# Patient Record
Sex: Male | Born: 1980 | Race: Black or African American | Hispanic: No | Marital: Single | Smoking: Current every day smoker
Health system: Southern US, Community
[De-identification: ages and names within clinical notes are randomized; demographics above are authoritative.]

---

## 2014-04-07 ENCOUNTER — Emergency Department (HOSPITAL_BASED_OUTPATIENT_CLINIC_OR_DEPARTMENT_OTHER)
Admission: EM | Admit: 2014-04-07 | Discharge: 2014-04-07 | Disposition: A | Discharge status: Home / Self Care | Payer: Self-pay | Attending: Emergency Medicine | Admitting: Emergency Medicine

## 2014-04-07 ENCOUNTER — Encounter (HOSPITAL_BASED_OUTPATIENT_CLINIC_OR_DEPARTMENT_OTHER): Payer: Self-pay | Admitting: *Deleted

## 2014-04-07 DIAGNOSIS — Z720 Tobacco use: Secondary | ICD-10-CM | POA: Insufficient documentation

## 2014-04-07 DIAGNOSIS — Z202 Contact with and (suspected) exposure to infections with a predominantly sexual mode of transmission: Secondary | ICD-10-CM | POA: Insufficient documentation

## 2014-04-07 DIAGNOSIS — R109 Unspecified abdominal pain: Secondary | ICD-10-CM

## 2014-04-07 DIAGNOSIS — R1030 Lower abdominal pain, unspecified: Secondary | ICD-10-CM | POA: Insufficient documentation

## 2014-04-07 LAB — URINALYSIS, ROUTINE W REFLEX MICROSCOPIC
BILIRUBIN URINE: NEGATIVE
Glucose, UA: NEGATIVE mg/dL
HGB URINE DIPSTICK: NEGATIVE
KETONES UR: NEGATIVE mg/dL
Nitrite: NEGATIVE
PH: 5.5 (ref 5.0–8.0)
Protein, ur: NEGATIVE mg/dL
Specific Gravity, Urine: 1.018 (ref 1.005–1.030)
Urobilinogen, UA: 1 mg/dL (ref 0.0–1.0)

## 2014-04-07 LAB — URINE MICROSCOPIC-ADD ON

## 2014-04-07 MED ORDER — LIDOCAINE HCL (PF) 1 % IJ SOLN
INTRAMUSCULAR | Status: AC
  Administered 2014-04-07: 1.2 mL
  Filled 2014-04-07: qty 5

## 2014-04-07 MED ORDER — CEFTRIAXONE SODIUM 250 MG IJ SOLR
250.0000 mg | Freq: Once | INTRAMUSCULAR | Status: AC
  Administered 2014-04-07: 250 mg via INTRAMUSCULAR
  Filled 2014-04-07: qty 250

## 2014-04-07 MED ORDER — AZITHROMYCIN 250 MG PO TABS
1000.0000 mg | ORAL_TABLET | Freq: Once | ORAL | Status: AC
  Administered 2014-04-07: 1000 mg via ORAL
  Filled 2014-04-07: qty 4

## 2014-04-07 MED ORDER — ONDANSETRON 4 MG PO TBDP
4.0000 mg | ORAL_TABLET | Freq: Once | ORAL | Status: AC
  Administered 2014-04-07: 4 mg via ORAL
  Filled 2014-04-07: qty 1

## 2014-04-07 NOTE — ED Provider Notes (Signed)
CSN: [id number]     Arrival date & time 04/07/14  1941 History  This chart was scribed for Mirian MoMatthew Gentry, MD by Gwenyth Oberatherine Macek, ED Scribe. This patient was seen in room MH09/MH09 and the patient's care was started at 8:30 PM.    Chief Complaint  Patient presents with  . Abdominal Pain    The history is provided by the patient. No language interpreter was used.    HPI Comments: Darryl Ingram is a 33 y.o. male who presents to the Emergency Department complaining of constant, mild lower abdominal pain that started 9 hours ago. He states no associated symptoms and denies a history of similar symptoms. Pt also notes that a sexual partner recently tested positive for gonorrhea and that he is here to be tested for STDs. He denies a history of STI.  Pt denies nausea, vomiting, diarrhea, constipation, testicular pain, penile pain, penile discharge and dysuria as assiociated symptoms.  Timing constant.  Exacerbated by movement and palpation.    History reviewed. No pertinent past medical history. History reviewed. No pertinent past surgical history. History reviewed. No pertinent family history. History  Substance Use Topics  . Smoking status: Current Every Day Smoker -- 2.00 packs/day    Types: Cigarettes  . Smokeless tobacco: Not on file  . Alcohol Use: No    Review of Systems  Gastrointestinal: Positive for abdominal pain. Negative for nausea, vomiting, diarrhea and constipation.  Genitourinary: Negative for dysuria, discharge, penile pain and testicular pain.  All other systems reviewed and are negative.  Allergies  Review of patient's allergies indicates no known allergies.  Home Medications   Prior to Admission medications   Not on File   BP 134/92 mmHg  Pulse 74  Temp(Src) 98.1 F (36.7 C) (Oral)  Resp 18  Ht 6\' 2"  (1.88 m)  Wt 165 lb (74.844 kg)  BMI 21.18 kg/m2  SpO2 100% Physical Exam  Constitutional: He is oriented to person, place, and time. He appears well-developed  and well-nourished.  HENT:  Head: Normocephalic and atraumatic.  Eyes: Conjunctivae and EOM are normal.  Neck: Normal range of motion. Neck supple.  Cardiovascular: Normal rate, regular rhythm and normal heart sounds.   Pulmonary/Chest: Effort normal and breath sounds normal. No respiratory distress.  Abdominal: He exhibits no distension. There is tenderness in the suprapubic area. There is no rebound and no guarding.  Genitourinary: Testes normal and penis normal. Cremasteric reflex is present. No discharge found.  Musculoskeletal: Normal range of motion.  Neurological: He is alert and oriented to person, place, and time.  Skin: Skin is warm and dry.  Vitals reviewed.   ED Course  Procedures (including critical care time) DIAGNOSTIC STUDIES: Oxygen Saturation is 100% on RA, normal by my interpretation.    COORDINATION OF CARE: 8:34 PM Discussed treatment plan for gonorrhea and pt agreed to plan. Advised pt to follow up and seek testing for comprehensive STD screening.   Labs Review Labs Reviewed  URINALYSIS, ROUTINE W REFLEX MICROSCOPIC - Abnormal; Notable for the following:    Leukocytes, UA SMALL (*)    All other components within normal limits  URINE MICROSCOPIC-ADD ON    Imaging Review No results found.   EKG Interpretation None      MDM   Final diagnoses:  Exposure to STD  Abdominal pain, acute    33 y.o. male without pertinent PMH presents with abd pain, exposure to STI.  He denies dysuria, discharge, and states that the nature of his symptoms  are very mild.  His primary precipitant is concern about STI.  On arrival vital signs and physical exam as above. No obvious signs of infection. Will treat empirically given symptoms and exposure. Discussed importance of follow-up for further STI checks, patient refused syphilis and HIV testing at this time. Discharged home in stable condition.    1. Exposure to STD   2. Abdominal pain, acute           Mirian MoMatthew  Gentry, MD 04/07/14 2138

## 2014-04-07 NOTE — Discharge Instructions (Signed)
Chlamydia °Chlamydia is an infection. It is spread through sexual contact. Chlamydia can be in different areas of the body. These areas include the urethra, throat, or rectum. It is important to treat chlamydia as soon as possible. It can damage other organs.  °CAUSES  °Chlamydia is caused by bacteria. It is a sexually transmitted disease. This means that it is passed from an infected partner during intimate contact. This contact could be with the genitals, mouth, or rectal area.  °SIGNS AND SYMPTOMS  °There may not be any symptoms. This is often the case early in the infection. If there are symptoms, they are usually mild and may only be noticeable in the morning. Symptoms you may notice include:  °· Burning with urination. °· Pain or swelling in the testicles. °· Watery mucus-like discharge from the penis. °· Long-standing (chronic) pelvic pain after frequent infections. °· Pain, swelling, or itching around the anus. °· A sore throat. °· Itching, burning, or redness in the eyes, or discharge from the eyes. °DIAGNOSIS  °To diagnose this infection, your health care provider will do a pelvic exam. A sample of urine or a swab from the rectum may be taken for testing.  °TREATMENT  °Chlamydia is treated with antibiotic medicines.  °HOME CARE INSTRUCTIONS °· Take your antibiotic medicine as directed by your health care provider. Finish the antibiotic even if you start to feel better. Incomplete treatment will put you at risk for not being able to have children (sterility).   °· Take medicines only as directed by your health care provider.   °· Rest.   °· Inform any sexual partners about your infection. Even if they are symptom free or have a negative culture or evaluation, they should be treated for the condition.   °· Do not have sex (intercourse) until treatment is completed and your health care provider says it is okay.   °· Keep all follow-up visits as directed by your health care provider.   °· Not all test results  are available during your visit. If your test results are not back during the visit, make an appointment with your health care provider to find out the results. Do not assume everything is normal if you have not heard from your health care provider or the medical facility. It is your responsibility to get your test results. °SEEK MEDICAL CARE IF: °· You develop new joint pain. °· You have a fever. °SEEK IMMEDIATE MEDICAL CARE IF:  °· Your pain increases.   °· You have abnormal discharge.   °· You have pain during intercourse. °MAKE SURE YOU:  °· Understand these instructions. °· Will watch your condition. °· Will get help right away if you are not doing well or get worse. °Document Released: 04/24/2005 Document Revised: 09/08/2013 Document Reviewed: 10/31/2012 °ExitCare® Patient Information ©2015 [employer]. This information is not intended to replace advice given to you by your health care provider. Make sure you discuss any questions you have with your health care provider. °Gonorrhea °Gonorrhea is an infection that can cause serious problems. If left untreated, the infection may:  °· Damage the male or male organs.   °· Cause women to be unable to have children (sterility).   °· Harm a fetus if the infected woman is pregnant.   °It is important to get treatment for gonorrhea as soon as possible. It is also necessary that all your sexual partners be tested for the infection.  °CAUSES  °Gonorrhea is caused by bacteria called Neisseria gonorrhoeae. The infection is spread from person to person, usually by sexual   contact (such as by anal, vaginal, or oral means). A newborn can contract the infection from his or her mother during birth.  °SYMPTOMS  °Some people with gonorrhea do not have symptoms. Symptoms may be different in females and males.  °Females  °The most common symptoms are:  °· Pain in the lower abdomen.   °· Fever with or without chills.   °Other symptoms include:  °· Abnormal vaginal discharge.    °· Painful intercourse.   °· Burning or itching of the vagina or lips of the vagina.   °· Abnormal vaginal bleeding.   °· Pain when urinating.   °· Long-lasting (chronic) pain in the lower abdomen, especially during menstruation or intercourse.   °· Inability to become pregnant.   °· Going into premature labor.   °· Irritation, pain, bleeding, or discharge from the rectum. This may occur if the infection was spread by anal sex.   °· Sore throat or swollen lymph nodes in the neck. This may occur if the infection was spread by oral sex.   °Males  °The most common symptoms are:  °· Discharge from the penis.   °· Pain or burning during urination.   °· Pain or swelling in the testicles. °Other symptoms may include:  °· Irritation, pain, bleeding, or discharge from the rectum. This may occur if the infection was spread by anal sex.   °· Sore throat, fever, or swollen lymph nodes in the neck. This may occur if the infection was spread by oral sex.   °DIAGNOSIS  °A diagnosis is made after a physical exam is done and a sample of discharge is examined under a microscope for the presence of the bacteria. The discharge may be taken from the urethra, cervix, throat, or rectum.  °TREATMENT  °Gonorrhea is treated with antibiotic medicines. It is important for treatment to begin as soon as possible. Early treatment may prevent some problems from developing.  °HOME CARE INSTRUCTIONS  °· Take medicines only as directed by your health care provider.   °· Take your antibiotic medicine as directed by your health care provider. Finish the antibiotic even if you start to feel better. Incomplete treatment will put you at risk for continued infection.   °· Do not have sex until treatment is complete or as directed by your health care provider.   °· Keep all follow-up visits as directed by your health care provider.   °· Not all test results are available during your visit. If your test results are not back during the visit, make an  appointment with your health care provider to find out the results. Do not assume everything is normal if you have not heard from your health care provider or the medical facility. It is your responsibility to get your test results. °· If you test positive for gonorrhea, inform your recent sexual partners. They need to be checked for gonorrhea even if they do not have symptoms. They may need treatment, even if they test negative for gonorrhea.   °SEEK MEDICAL CARE IF:  °· You develop any bad reaction to the medicine you were prescribed. This may include:   °¨ A rash.   °¨ Nausea.   °¨ Vomiting.   °¨ Diarrhea.   °· Your symptoms do not improve after a few days of taking antibiotics.   °· Your symptoms get worse.   °· You develop increased pain, such as in the testicles (for males) or in the abdomen (for females). °· You have a fever. °MAKE SURE YOU:  °· Understand these instructions. °· Will watch your condition. °· Will get help right away if you are not doing well or get worse. °  Document Released: 04/21/2000 Document Revised: 09/08/2013 Document Reviewed: 10/30/2012 °ExitCare® Patient Information ©2015 [employer]. This information is not intended to replace advice given to you by your health care provider. Make sure you discuss any questions you have with your health care provider. ° ° °Emergency Department Resource Guide °1) Find a Doctor and Pay Out of Pocket °Although you won't have to find out who is covered by your insurance plan, it is a good idea to ask around and get recommendations. You will then need to call the office and see if the doctor you have chosen will accept you as a new patient and what types of options they offer for patients who are self-pay. Some doctors offer discounts or will set up payment plans for their patients who do not have insurance, but you will need to ask so you aren't surprised when you get to your appointment. ° °2) Contact Your Local Health Department °Not all health  departments have doctors that can see patients for sick visits, but many do, so it is worth a call to see if yours does. If you don't know where your local health department is, you can check in your phone book. The CDC also has a tool to help you locate your state's health department, and many state websites also have listings of all of their local health departments. ° °3) Find a Walk-in Clinic °If your illness is not likely to be very severe or complicated, you may want to try a walk in clinic. These are popping up all over the country in pharmacies, drugstores, and shopping centers. They're usually staffed by nurse practitioners or physician assistants that have been trained to treat common illnesses and complaints. They're usually fairly quick and inexpensive. However, if you have serious medical issues or chronic medical problems, these are probably not your best option. ° °No Primary Care Doctor: °- Call [facility] at  832-8000 - they can help you locate a primary care doctor that  accepts your insurance, provides certain services, etc. °- Physician Referral Service- 1-800-533-3463 ° °Chronic Pain Problems: °Organization         Address  Phone   Notes  °[facility]  (336) 297-2271 Patients need to be referred by their primary care doctor.  ° °Medication Assistance: °Organization         Address  Phone   Notes  °[place] [facility] [address] °[place], [place] [address] (336) 641-8030 --Must be a resident of [place] °-- Must have NO insurance coverage whatsoever (no Medicaid/ Medicare, etc.) °-- The pt. MUST have a primary care doctor that directs their care regularly and follows them in the community °  °[facility]  (866) 331-1348   °[employer]  (888) 892-1162   ° °Agencies that provide inexpensive medical care: °Organization         Address  Phone   Notes  °[facility]  (336) 832-8035   °[facility]     (336) 832-7272   °[facility] [address] °[place], [place] [address] (336) 832-4777   °[facility] [address], °[place] (336) 271-4999   °[facility]    (336) 373-0678   °[facility]    (336) 272-1050   °[facility] ° [address], [place] Phone:  (336) 832-4444, Fax:  (336) 832-4440 Hours of Operation:  9 am - 6 pm, M-F.    Also accepts Medicaid/Medicare and self-pay.  °[facility] ° [address], [place] Phone: (336) 832-3150, Fax: (336) 832-3151. Hours of Operation:  8:30 am - 5:30 pm, M-F.  Also accepts Medicaid and self-pay.  °[facility] [place] [address], [place] Phone: (336) 878-6027   °[facility] [address], [place], [place] (336)723-1848, Ext. 123 Mondays & Thursdays: 7-9 AM.  First 15 patients are seen on a first come, first serve basis. °  ° °Medicaid-accepting [place] Providers: ° °Organization         Address  Phone   Notes  °[facility] [address], [place] (336) 641-2100 Also accepts self-pay patients.  °[facility] [address], [place] ° (336) 856-9996   °[facility] [address], [place] (336) 288-8857   °[facility] [address] [place] [address], [place] (336) 299-7000   °Veita Bland [address], [place]  ° (336) 373-1557 Only accepts [place] Access Medicaid patients after they have their name applied to their card.  ° °Self-Pay (no insurance) in [place]: ° °Organization         Address  Phone   Notes  °Sickle Cell Patients, [facility] [address], [place] (336) 832-1970   °[facility] [address], [place] (336) 832-4400   °[facility] [place] ° [address], [place] (336) 992-4800    °[facility]/Dr. Osei-Bonsu ° [address] [place] [address], [place] or [address], [place] (336) 841-8500 Phone number for both [place] and [place] locations is the same.  °[facility] [address], [place] (336) 299-0000   °[facility] [address] [place] [address], [place] or [address] (336) 852-7530 °(336) 878-2260   °[facility] [address], [place] (336) 350-1642, phone; (336) 294-5005, fax Sees patients 1st and 3rd Saturday of every month.  Must not qualify for public or private insurance (i.e. Medicaid, Medicare, [place] Health Choice, Veterans' Benefits) • Household income should be no more than 200% of the poverty level •The clinic cannot treat you if you are pregnant or think you are pregnant • Sexually transmitted diseases are not treated at the clinic.  ° ° °Dental Care: °Organization         Address  Phone  Notes  °[facility] [address], [place] (336) 641-6152 Accepts children up to age 21 who are enrolled in Medicaid or [place] Health Choice; pregnant women with a Medicaid card; and children who have applied for Medicaid or [place] Health Choice, but were declined, whose parents can pay a reduced fee at time of service.  °[facility] [place]  [address], [place] (336) 641-7733 Accepts children up to age 21 who are enrolled in Medicaid or [place] Health Choice; pregnant women with a Medicaid card; and children who have applied for Medicaid or  Health Choice, but were declined, whose parents can pay a reduced fee at time of service.  °[facility] ° [address], [place] (336) 641-4533 Patients are seen by appointment only. Walk-ins are not accepted. [facility] will see patients 18 years of age and older. °Monday - Tuesday (8am-5pm) °  Most Wednesdays (8:30-5pm) °$30 per visit,  cash only  °[facility] ° [address], [place] (336) 641-4533 Patients are seen by appointment only. Walk-ins are not accepted. [facility] will see patients 18 years of age and older. °One Wednesday Evening (Monthly: Volunteer Based).  $30 per visit, cash only  °[facility]  (919) 537-3737 for adults; Children under age 4, call Graduate Pediatric Dentistry at (919) [phone]. Children aged 4-14, please call (919) [phone] to request a pediatric application. ° Dental services are provided in all areas of dental care including fillings, crowns and bridges, complete and partial dentures, implants, gum treatment, root canals, and extractions. Preventive care is also provided. Treatment is provided to both adults and children. °Patients are selected via a lottery and there is often a waiting list. °  °[facility] [address], °[place] ° (336) 763-8833 [email or web address] °  °[facility] [address], [place], [place] (336)723-1848, Ext. 123 Second and Fourth Thursday of each month, opens at 6:30 AM; Clinic ends at 9 AM.  Patients are seen on a first-come first-served basis, and a limited number are seen during each clinic.  ° °[facility] ° [address], [place], [place] (336) 723-7904   Eligibility Requirements °You must have lived in [place], [place], or [place] counties for at least the last three months. °  You cannot be eligible for state or federal sponsored healthcare insurance, including Veterans Administration, Medicaid, or Medicare. °  You generally cannot be eligible for healthcare insurance through your employer.  °  How to apply: °Eligibility screenings are held every Tuesday and Wednesday afternoon from 1:00 pm until 4:00 pm. You do not need an appointment for the interview!  °[facility] [address], [place], [place] 336-631-2330   °[facility]   336-342-8273   °[facility]  336-703-3100   °[facility]  336-570-6415   ° °Behavioral Health Resources in the Community: °Intensive Outpatient Programs °Organization         Address  Phone  Notes  °[facility] [address], [place], [place] 336-878-6098   °[facility] [address], [place], [place] 336-832-9800   °[facility] [address], [place], [place] ° 336-882-2125   °[facility] [address],  °[place], [place] 1-800-853-5163 or 336-641-4981   °Substance Abuse Resources °Organization         Address  Phone  Notes  °[facility]  336-882-2125   °[facility]  336-784-9470   °[facility]  336-285-9073   °[facility]  336-845-3988   °Residential & Outpatient Substance Abuse Program  1-800-659-3381   °Psychological Services °Organization         Address  Phone  Notes  °[facility]  336- 832-9600   °[facility]  336- 378-7881   °[facility] [address], [place] 1-800-853-5163 or 336-641-4981   ° °Mobile Crisis Teams °Organization         Address  Phone  Notes  °[facility], Mobile Crisis Care Unit  1-877-626-1772   °Assertive °[facility] ° [address] [place], [place] 336-834-9664   °Sharon DeEsch [address] °[place] 336-554-5454   ° °Self-Help/Support Groups °Organization         Address  Phone               Notes  °[facility] - variety of support groups  336- 373-1402 Call for more information  °Narcotics Anonymous (NA), [facility] [address], °[place]  2 meetings at this location  ° °Residential Treatment Programs °Organization         Address  Phone  Notes  °[facility] [address],    °[place]  1-866-801-8205   °[facility] ° [address], [place], [place] 704-293-8524    °[facility] [address], [place] 336-845-3988 Admissions: 8am-3pm M-F  °[facility] [address]    °[place], [place] 336-841-1104   °[facility] [address], [place], [place] 336-379-7146   °[facility] [address]  °[place], [place] 336-285-9073   °[facility] - Intensive Outpatient [address], [place], [place] 336-852-3033   °[facility] [address]  °[place], [place] 1-877-615-2722 or 336-784-9470   °[facility] [address] [place], [place] 336-227-7417 Accepts Medicaid  °[facility] [address]  °[place] 1-800-659-3381 Substance Abuse/Addiction Treatment  ° °[place] Behavioral Health Resources °Organization         Address  Phone  Notes  °[facility]  (888) 581-9988   °Julie Brannon, PhD [address] [place], [place]   (336) 349-5553 or (336) 951-0000   °[facility]   [address] °[place], [place] (336) 349-4454   °[facility] [address], [place], [place] (336) 342-8316 Insurance/Medicaid/sponsorship through [facility]  °[facility] [address]                                    [place], [place] (336) 342-8316 Therapy/tele-psych/case  °[facility] [address]  ° [place], [place] (336) 349-2233    °Dr. Arfeen  (336) 349-4544   °[facility] [place]  [employer] [place] [facility] 1) [address], [place] °2) [address], [place] °3)  [address], [place] (336) 349-3220 °(336) 342-7768 ° °(336) 342-8140   °[place] [facility] (336) 342-1394 or (336) 342-3537 (After Hours)    ° °  ° °

## 2014-04-07 NOTE — ED Notes (Signed)
Pt c/o lower abd pain denies  n/v/d, wants checked for std exposed to STD

## 2015-04-30 ENCOUNTER — Emergency Department (HOSPITAL_BASED_OUTPATIENT_CLINIC_OR_DEPARTMENT_OTHER)
Admission: EM | Admit: 2015-04-30 | Discharge: 2015-04-30 | Disposition: A | Discharge status: Home / Self Care | Payer: Self-pay | Attending: Physician Assistant | Admitting: Physician Assistant

## 2015-04-30 ENCOUNTER — Encounter (HOSPITAL_BASED_OUTPATIENT_CLINIC_OR_DEPARTMENT_OTHER): Payer: Self-pay

## 2015-04-30 DIAGNOSIS — Z8619 Personal history of other infectious and parasitic diseases: Secondary | ICD-10-CM | POA: Insufficient documentation

## 2015-04-30 DIAGNOSIS — F17210 Nicotine dependence, cigarettes, uncomplicated: Secondary | ICD-10-CM | POA: Insufficient documentation

## 2015-04-30 DIAGNOSIS — R1030 Lower abdominal pain, unspecified: Secondary | ICD-10-CM | POA: Insufficient documentation

## 2015-04-30 DIAGNOSIS — R369 Urethral discharge, unspecified: Secondary | ICD-10-CM | POA: Insufficient documentation

## 2015-04-30 MED ORDER — AZITHROMYCIN 250 MG PO TABS
1000.0000 mg | ORAL_TABLET | Freq: Once | ORAL | Status: AC
  Administered 2015-04-30: 1000 mg via ORAL
  Filled 2015-04-30: qty 4

## 2015-04-30 MED ORDER — CEFTRIAXONE SODIUM 250 MG IJ SOLR
250.0000 mg | Freq: Once | INTRAMUSCULAR | Status: AC
  Administered 2015-04-30: 250 mg via INTRAMUSCULAR
  Filled 2015-04-30: qty 250

## 2015-04-30 MED ORDER — LIDOCAINE HCL (PF) 1 % IJ SOLN
INTRAMUSCULAR | Status: AC
  Administered 2015-04-30: 1.2 mL
  Filled 2015-04-30: qty 5

## 2015-04-30 NOTE — ED Notes (Signed)
Pt c/o lower back pain, abd pain and penile d/c-pt outside smoking when called for triage-NAD

## 2015-04-30 NOTE — Discharge Instructions (Signed)
Mr. Darryl Ingram,  Nice meeting you! WEAR A CONDOM EVERY TIME YOU HAVE SEX. If your test results come back positive, you need to notify your partner and they need to be treated. Do not have intercourse with that person until they are treated, otherwise you will get infected again. Please follow-up with your primary care. Return to the emergency department if your abdominal pain worsens, if you develop fevers, chills. Feel better soon!  S. Lane HackerNicole Sarah-Jane Nazario, PA-C

## 2015-04-30 NOTE — ED Notes (Signed)
Pt refused to have the GC/Chlamydia swab done, Stating, "That's gonna hurt, Hell no. I just want the medicine."

## 2015-04-30 NOTE — ED Notes (Signed)
PA at bedside to collect swab .

## 2015-05-01 LAB — HIV ANTIBODY (ROUTINE TESTING W REFLEX): HIV Screen 4th Generation wRfx: NONREACTIVE

## 2015-05-01 LAB — RPR: RPR Ser Ql: NONREACTIVE

## 2015-05-04 NOTE — ED Provider Notes (Signed)
CSN: [id number]     Arrival date & time 04/30/15  1303 History   First MD Initiated Contact with Patient 04/30/15 1410     Chief Complaint  Patient presents with  . Back Pain   HPI   Darryl Ingram is a 34 y.o. M PMH significant for STI exposure presenting with a one week history of penile discharge and generalized lower abdominal pain. He describes the pain as 5/10 pain scale, intermittent, radiating to his back periodically, and similar to when he was diagnosed with chlamydia in the past. He is here mostly for yellow discharge from his penis, and he thinks his sexual partners was recently diagnosed with a chlamydia. He denies fevers, chills, CP, palpitations, N/V/D, recent abx use.   History reviewed. No pertinent past medical history. History reviewed. No pertinent past surgical history. No family history on file. Social History  Substance Use Topics  . Smoking status: Current Every Day Smoker -- 2.00 packs/day    Types: Cigarettes  . Smokeless tobacco: None  . Alcohol Use: Yes     Comment: weekly    Review of Systems  Ten systems are reviewed and are negative for acute change except as noted in the HPI   Allergies  Review of patient's allergies indicates no known allergies.  Home Medications   Prior to Admission medications   Not on File   BP 130/89 mmHg  Pulse 57  Temp(Src) 97.7 F (36.5 C) (Oral)  Resp 16  Ht 6\' 1"  (1.854 m)  Wt 77.111 kg  BMI 22.43 kg/m2  SpO2 100% Physical Exam  Constitutional: He appears well-developed and well-nourished. No distress.  HENT:  Head: Normocephalic and atraumatic.  Eyes: Conjunctivae are normal. Pupils are equal, round, and reactive to light. Right eye exhibits no discharge. Left eye exhibits no discharge. No scleral icterus.  Cardiovascular: Normal rate, regular rhythm, normal heart sounds and intact distal pulses.  Exam reveals no gallop and no friction rub.   No murmur heard. Pulmonary/Chest: Effort normal and breath sounds  normal. No respiratory distress. He has no wheezes. He has no rales. He exhibits no tenderness.  Abdominal: Soft. Bowel sounds are normal. He exhibits no distension and no mass. There is no tenderness. There is no rebound and no guarding.  Musculoskeletal: He exhibits no edema.  Neurological: He is alert. Coordination normal.  Skin: Skin is warm and dry. No rash noted. He is not diaphoretic. No erythema.  Psychiatric: He has a normal mood and affect. His behavior is normal.  Nursing note and vitals reviewed.   ED Course  Procedures Labs Review Labs Reviewed  RPR  HIV ANTIBODY (ROUTINE TESTING)  GC[facility]    MDM   Final diagnoses:  Penile discharge   Patient non-toxic appearing and VSS. Based on patient history and physical exam, most likely etiologies include STI. Less likely etiologies include early appendicitis, mesenteric ischemia, gastroenteritis, peritonitis, AAA, SBO, large bowel obstruction/volvulus, SBP, IBD, colitis, DKA, sickle cell crisis, IBS, food allergy. Will treat empirically and obtain GC/chlamydia swab. Patient refused swab. Will obtain urine sample. Patient may be safely discharged home. Discussed reasons for return. Patient to follow-up with primary care provider within one week. Discussed safe sex practices in great detail. Patient in understanding and agreement with the plan.    Melton KrebsSamantha Nicole Aariel Ems, PA-C 05/04/15 2229  Courteney Randall AnLyn Mackuen, MD 05/04/15 2352

## 2015-10-10 ENCOUNTER — Encounter (HOSPITAL_BASED_OUTPATIENT_CLINIC_OR_DEPARTMENT_OTHER): Payer: Self-pay | Admitting: *Deleted

## 2015-10-10 ENCOUNTER — Emergency Department (HOSPITAL_BASED_OUTPATIENT_CLINIC_OR_DEPARTMENT_OTHER)
Admission: EM | Admit: 2015-10-10 | Discharge: 2015-10-10 | Discharge status: Against Medical Advice | Payer: Self-pay | Attending: Emergency Medicine | Admitting: Emergency Medicine

## 2015-10-10 DIAGNOSIS — F17210 Nicotine dependence, cigarettes, uncomplicated: Secondary | ICD-10-CM | POA: Insufficient documentation

## 2015-10-10 DIAGNOSIS — R369 Urethral discharge, unspecified: Secondary | ICD-10-CM | POA: Insufficient documentation

## 2015-10-10 DIAGNOSIS — Z711 Person with feared health complaint in whom no diagnosis is made: Secondary | ICD-10-CM

## 2015-10-10 NOTE — ED Provider Notes (Signed)
CSN: [id number]     Arrival date & time 10/10/15  1306 History   First MD Initiated Contact with Patient 10/10/15 1317     Chief Complaint  Patient presents with  . Penile Discharge    Darryl Ingram is a 35 y.o. male who presents to the ED complaining of penile discharge for one day. He reports being sexually active and not using protection. He reports having some intermittent mild bilateral lower abdominal pain today. He reports feeling nauseated but having vomiting. He complains of penile discharge but no penile pain or testicle pain. He denies any lesions or rashes. He reports history of previous STDs.  Patient is a 35 y.o. male presenting with penile discharge. The history is provided by the patient. No language interpreter was used.  Penile Discharge Associated symptoms include abdominal pain. Pertinent negatives include no fever, nausea, rash or vomiting.    History reviewed. No pertinent past medical history. History reviewed. No pertinent past surgical history. History reviewed. No pertinent family history. Social History  Substance Use Topics  . Smoking status: Current Every Day Smoker -- 2.00 packs/day    Types: Cigarettes  . Smokeless tobacco: None  . Alcohol Use: Yes     Comment: weekly    Review of Systems  Constitutional: Negative for fever.  Gastrointestinal: Positive for abdominal pain. Negative for nausea and vomiting.  Genitourinary: Positive for discharge. Negative for dysuria, flank pain, scrotal swelling, difficulty urinating and testicular pain.  Skin: Negative for rash.      Allergies  Review of patient's allergies indicates no known allergies.  Home Medications   Prior to Admission medications   Not on File   BP 121/94 mmHg  Pulse 70  Temp(Src) 98.7 F (37.1 C) (Oral)  Resp 18  Ht 6\' 2"  (1.88 m)  Wt 77.111 kg  BMI 21.82 kg/m2  SpO2 99% Physical Exam  Constitutional: He appears well-developed and well-nourished. No distress.  HENT:  Head:  Normocephalic and atraumatic.  Eyes: Right eye exhibits no discharge. Left eye exhibits no discharge.  Cardiovascular: Normal rate, regular rhythm, normal heart sounds and intact distal pulses.   Pulmonary/Chest: Effort normal and breath sounds normal. No respiratory distress.  Abdominal: Soft. He exhibits no distension. There is no tenderness. There is no rebound.  Abdomen is soft and nontender to palpation.  Genitourinary:  Refuses   Neurological: He is alert. Coordination normal.  Skin: Skin is warm and dry. No rash noted. He is not diaphoretic. No erythema. No pallor.  Psychiatric: His speech is normal. His affect is angry. He is aggressive.  Nursing note and vitals reviewed.   ED Course  Procedures (including critical care time) Labs Review Labs Reviewed - No data to display  Imaging Review No results found.    EKG Interpretation None      Filed Vitals:   10/10/15 1314  BP: 121/94  Pulse: 70  Temp: 98.7 F (37.1 C)  TempSrc: Oral  Resp: 18  Height: 6\' 2"  (1.88 m)  Weight: 77.111 kg  SpO2: 99%     MDM   Meds given in ED:  Medications - No data to display  New Prescriptions   No medications on file    Final diagnoses:  Concern about STD in male without diagnosis   This is a 35 y.o. male who presents to the ED complaining of penile discharge for one day. He reports being sexually active and not using protection. He reports having some intermittent mild bilateral lower abdominal  pain today. He reports feeling nauseated but having vomiting. He complains of penile discharge but no penile pain or testicle pain. He denies any lesions or rashes. He reports history of previous STDs. On exam the patient is afebrile nontoxic appearing. His abdomen is soft and nontender to palpation.  Patient refuses GU exam. I explained in detail the reasons why we need to test for gonorrhea and Chlamydia and to examine his genitals. Patient reports he does not want anyone to touch him  and he just wants a shot and to go. While in the room with my male ER tech as chaperone I again explained the need to do a GU exam. Patient became agitated and aggressive. He began yelling and told me he did not want "no fuckers fondling my balls."  I again explained that I was his provider and I would be doing a physical exam and that this was important for his health. He then stood up and got in my face cursing at me from across the stretcher and I felt threatened. I walked out of the room and told him if he did not want to follow my medical advice he could go to the health department or somewhere else. The patient got dressed and walked out yelling at me "don't fucking talk to me." He left the ED without further incident.     Everlene Farrier, PA-C 10/10/15 1351  Lavera Guise, MD 10/11/15 [phone]

## 2015-10-10 NOTE — ED Notes (Signed)
Pt leaving the department at this time.

## 2015-10-10 NOTE — ED Notes (Signed)
Pt reports lower abdominal pain and penile discharge x 2 days.  Denies urinary symptoms.

## 2015-10-11 ENCOUNTER — Encounter (HOSPITAL_BASED_OUTPATIENT_CLINIC_OR_DEPARTMENT_OTHER): Payer: Self-pay | Admitting: *Deleted

## 2015-10-11 ENCOUNTER — Emergency Department (HOSPITAL_BASED_OUTPATIENT_CLINIC_OR_DEPARTMENT_OTHER)
Admission: EM | Admit: 2015-10-11 | Discharge: 2015-10-11 | Disposition: A | Discharge status: Home / Self Care | Payer: Self-pay | Attending: Emergency Medicine | Admitting: Emergency Medicine

## 2015-10-11 DIAGNOSIS — Z202 Contact with and (suspected) exposure to infections with a predominantly sexual mode of transmission: Secondary | ICD-10-CM | POA: Insufficient documentation

## 2015-10-11 DIAGNOSIS — F17210 Nicotine dependence, cigarettes, uncomplicated: Secondary | ICD-10-CM | POA: Insufficient documentation

## 2015-10-11 MED ORDER — CEFTRIAXONE SODIUM 250 MG IJ SOLR
250.0000 mg | Freq: Once | INTRAMUSCULAR | Status: AC
  Administered 2015-10-11: 250 mg via INTRAMUSCULAR
  Filled 2015-10-11: qty 250

## 2015-10-11 MED ORDER — LIDOCAINE HCL (PF) 1 % IJ SOLN
INTRAMUSCULAR | Status: AC
  Administered 2015-10-11: 0.9 mL
  Filled 2015-10-11: qty 5

## 2015-10-11 MED ORDER — AZITHROMYCIN 250 MG PO TABS
1000.0000 mg | ORAL_TABLET | Freq: Once | ORAL | Status: AC
  Administered 2015-10-11: 1000 mg via ORAL
  Filled 2015-10-11: qty 4

## 2015-10-11 NOTE — ED Notes (Signed)
Lab called to cancel UA per Dr Lynelle DoctorKnapp.

## 2015-10-11 NOTE — ED Provider Notes (Signed)
CSN: [id number]     Arrival date & time 10/11/15  1102 History   First MD Initiated Contact with Patient 10/11/15 1122     Chief Complaint  Patient presents with  . Exposure to STD    HPI Patient was seen in the emergency room yesterday after complaining of penile discharge for 1 day. Patient does have a new partner and has not been using protection. He denies any trouble with nausea vomiting or diarrhea. No penile or testicular pain. He denies any rashes. Patient refused treatment at this facility yesterday. He refused any physical exam. Patient states he felt uncomfortable with getting examined. He came back to the emergency room today because he was called by his partner and was told that she was being treated for an STD.  She had been seen in the health department and she told him he needed to get treatment. The patient called the health department but was told he needed to make an appointment. He decided to come back to this facility History reviewed. No pertinent past medical history. History reviewed. No pertinent past surgical history. No family history on file. Social History  Substance Use Topics  . Smoking status: Current Every Day Smoker -- 2.00 packs/day    Types: Cigarettes  . Smokeless tobacco: None  . Alcohol Use: Yes     Comment: weekly    Review of Systems  All other systems reviewed and are negative.     Allergies  Review of patient's allergies indicates no known allergies.  Home Medications   Prior to Admission medications   Not on File   BP 144/90 mmHg  Pulse 89  Temp(Src) 98 F (36.7 C) (Oral)  Resp 16  Ht 6\' 2"  (1.88 m)  Wt 77.111 kg  BMI 21.82 kg/m2  SpO2 99% Physical Exam  Constitutional: He appears well-developed and well-nourished. No distress.  HENT:  Head: Normocephalic and atraumatic.  Right Ear: External ear normal.  Left Ear: External ear normal.  Eyes: Conjunctivae are normal. Right eye exhibits no discharge. Left eye exhibits no  discharge. No scleral icterus.  Neck: Neck supple. No tracheal deviation present.  Cardiovascular: Normal rate.   Pulmonary/Chest: Effort normal. No stridor. No respiratory distress.  Genitourinary:  Exam deferred  Musculoskeletal: He exhibits no edema.  Neurological: He is alert. Cranial nerve deficit: no gross deficits.  Skin: Skin is warm and dry. No rash noted.  Psychiatric: He has a normal mood and affect.  Nursing note and vitals reviewed.   ED Course  Procedures (including critical care time)   MDM   Final diagnoses:  Exposure to STD    Will treat empirically for GC and ghonorrhea.  Recommend follow up at the health department.    Linwood DibblesJon Annaleia Pence, MD 10/11/15 1134

## 2015-10-11 NOTE — ED Notes (Signed)
States he was exposed to an STD and needs treatment. States he was here yesterday but refused treatment.

## 2015-10-11 NOTE — Discharge Instructions (Signed)
Sexually Transmitted Disease °A sexually transmitted disease (STD) is a disease or infection that may be passed (transmitted) from person to person, usually during sexual activity. This may happen by way of saliva, semen, blood, vaginal mucus, or urine. Common STDs include: °· Gonorrhea. °· Chlamydia. °· Syphilis. °· HIV and AIDS. °· Genital herpes. °· Hepatitis B and C. °· Trichomonas. °· Human papillomavirus (HPV). °· Pubic lice. °· Scabies. °· Mites. °· Bacterial vaginosis. °WHAT ARE CAUSES OF STDs? °An STD may be caused by bacteria, a virus, or parasites. STDs are often transmitted during sexual activity if one person is infected. However, they may also be transmitted through nonsexual means. STDs may be transmitted after:  °· Sexual intercourse with an infected person. °· Sharing sex toys with an infected person. °· Sharing needles with an infected person or using unclean piercing or tattoo needles. °· Having intimate contact with the genitals, mouth, or rectal areas of an infected person. °· Exposure to infected fluids during birth. °WHAT ARE THE SIGNS AND SYMPTOMS OF STDs? °Different STDs have different symptoms. Some people may not have any symptoms. If symptoms are present, they may include: °· Painful or bloody urination. °· Pain in the pelvis, abdomen, vagina, anus, throat, or eyes. °· A skin rash, itching, or irritation. °· Growths, ulcerations, blisters, or sores in the genital and anal areas. °· Abnormal vaginal discharge with or without bad odor. °· Penile discharge in men. °· Fever. °· Pain or bleeding during sexual intercourse. °· Swollen glands in the groin area. °· Yellow skin and eyes (jaundice). This is seen with hepatitis. °· Swollen testicles. °· Infertility. °· Sores and blisters in the mouth. °HOW ARE STDs DIAGNOSED? °To make a diagnosis, your health care provider may: °· Take a medical history. °· Perform a physical exam. °· Take a sample of any discharge to examine. °· Swab the throat,  cervix, opening to the penis, rectum, or vagina for testing. °· Test a sample of your first morning urine. °· Perform blood tests. °· Perform a Pap test, if this applies. °· Perform a colposcopy. °· Perform a laparoscopy. °HOW ARE STDs TREATED? °Treatment depends on the STD. Some STDs may be treated but not cured. °· Chlamydia, gonorrhea, trichomonas, and syphilis can be cured with antibiotic medicine. °· Genital herpes, hepatitis, and HIV can be treated, but not cured, with prescribed medicines. The medicines lessen symptoms. °· Genital warts from HPV can be treated with medicine or by freezing, burning (electrocautery), or surgery. Warts may come back. °· HPV cannot be cured with medicine or surgery. However, abnormal areas may be removed from the cervix, vagina, or vulva. °· If your diagnosis is confirmed, your recent sexual partners need treatment. This is true even if they are symptom-free or have a negative culture or evaluation. They should not have sex until their health care providers say it is okay. °· Your health care provider may test you for infection again 3 months after treatment. °HOW CAN I REDUCE MY RISK OF GETTING AN STD? °Take these steps to reduce your risk of getting an STD: °· Use latex condoms, dental dams, and water-soluble lubricants during sexual activity. Do not use petroleum jelly or oils. °· Avoid having multiple sex partners. °· Do not have sex with someone who has other sex partners °· Do not have sex with anyone you do not know or who is at high risk for an STD. °· Avoid risky sex practices that can break your skin. °· Do not have sex   if you have open sores on your mouth or skin. °· Avoid drinking too much alcohol or taking illegal drugs. Alcohol and drugs can affect your judgment and put you in a vulnerable position. °· Avoid engaging in oral and anal sex acts. °· Get vaccinated for HPV and hepatitis. If you have not received these vaccines in the past, talk to your health care  provider about whether one or both might be right for you. °· If you are at risk of being infected with HIV, it is recommended that you take a prescription medicine daily to prevent HIV infection. This is called pre-exposure prophylaxis (PrEP). You are considered at risk if: °¨ You are a man who has sex with other men (MSM). °¨ You are a heterosexual man or woman and are sexually active with more than one partner. °¨ You take drugs by injection. °¨ You are sexually active with a partner who has HIV. °· Talk with your health care provider about whether you are at high risk of being infected with HIV. If you choose to begin PrEP, you should first be tested for HIV. You should then be tested every 3 months for as long as you are taking PrEP. °WHAT SHOULD I DO IF I THINK I HAVE AN STD? °· See your health care provider. °· Tell your sexual partner(s). They should be tested and treated for any STDs. °· Do not have sex until your health care provider says it is okay. °WHEN SHOULD I GET IMMEDIATE MEDICAL CARE? °Contact your health care provider right away if:  °· You have severe abdominal pain. °· You are a man and notice swelling or pain in your testicles. °· You are a woman and notice swelling or pain in your vagina. °  °This information is not intended to replace advice given to you by your health care provider. Make sure you discuss any questions you have with your health care provider. °  °Document Released: 07/15/2002 Document Revised: 05/15/2014 Document Reviewed: 11/12/2012 °Elsevier Interactive Patient Education ©2016 [employer] ° °

## 2015-10-18 ENCOUNTER — Telehealth (HOSPITAL_BASED_OUTPATIENT_CLINIC_OR_DEPARTMENT_OTHER): Payer: Self-pay | Admitting: Emergency Medicine

## 2016-12-09 ENCOUNTER — Emergency Department (HOSPITAL_BASED_OUTPATIENT_CLINIC_OR_DEPARTMENT_OTHER)
Admission: EM | Admit: 2016-12-09 | Discharge: 2016-12-09 | Disposition: A | Discharge status: Home / Self Care | Payer: Self-pay | Attending: Emergency Medicine | Admitting: Emergency Medicine

## 2016-12-09 ENCOUNTER — Encounter (HOSPITAL_BASED_OUTPATIENT_CLINIC_OR_DEPARTMENT_OTHER): Payer: Self-pay | Admitting: *Deleted

## 2016-12-09 DIAGNOSIS — F17210 Nicotine dependence, cigarettes, uncomplicated: Secondary | ICD-10-CM | POA: Insufficient documentation

## 2016-12-09 DIAGNOSIS — Z202 Contact with and (suspected) exposure to infections with a predominantly sexual mode of transmission: Secondary | ICD-10-CM | POA: Insufficient documentation

## 2016-12-09 LAB — URINALYSIS, ROUTINE W REFLEX MICROSCOPIC
Bilirubin Urine: NEGATIVE
Glucose, UA: NEGATIVE mg/dL
HGB URINE DIPSTICK: NEGATIVE
KETONES UR: NEGATIVE mg/dL
LEUKOCYTES UA: NEGATIVE
Nitrite: NEGATIVE
Protein, ur: NEGATIVE mg/dL
Specific Gravity, Urine: 1.02 (ref 1.005–1.030)
pH: 5 (ref 5.0–8.0)

## 2016-12-09 MED ORDER — CEFTRIAXONE SODIUM 250 MG IJ SOLR
250.0000 mg | Freq: Once | INTRAMUSCULAR | Status: AC
  Administered 2016-12-09: 250 mg via INTRAMUSCULAR
  Filled 2016-12-09: qty 250

## 2016-12-09 MED ORDER — AZITHROMYCIN 250 MG PO TABS
1000.0000 mg | ORAL_TABLET | Freq: Once | ORAL | Status: AC
  Administered 2016-12-09: 1000 mg via ORAL
  Filled 2016-12-09: qty 4

## 2016-12-09 MED ORDER — METRONIDAZOLE 500 MG PO TABS
2000.0000 mg | ORAL_TABLET | Freq: Once | ORAL | Status: AC
  Administered 2016-12-09: 2000 mg via ORAL
  Filled 2016-12-09: qty 4

## 2016-12-09 NOTE — ED Notes (Signed)
2nd urine sample sent to lab.

## 2016-12-09 NOTE — Discharge Instructions (Signed)
Avoid any alcohol for the next 24 hours, the flagyl antibiotic can cause a reaction with alcohol (nausea, vomiting, and abdominal pain)  Follow up with a primary care doctor if your symptoms do not resolve over the next week

## 2016-12-09 NOTE — ED Notes (Signed)
ED Provider at bedside. 

## 2016-12-09 NOTE — ED Triage Notes (Signed)
Pt c/o scrotum pain x 1 month, denies penis discharge. Exposure t o STD

## 2016-12-09 NOTE — ED Provider Notes (Signed)
[facility]   CSN: [id number] Arrival date & time: 12/09/16  1326     History   Chief Complaint Chief Complaint  Patient presents with  . Testicle Pain    HPI Darryl Ingram is a 36 y.o. male.  HPI Patient presents to the emergency room for evaluation of scrotal pain as well as recently being told he was exposed to Trichomonas. Patient states she's had intermittent pain at the base of his scrotum off-and-on for the last month. The pain has been mild. He is also noticed a little bit of swelling in the lymph nodes in the right inguinal region. He denies any testicle pain. He denies any masses. He denies any penile discharge. Denies any dysuria. Patient states he was told the other day ago that he was exposed to Trichomonas. He had intercourse with his children's mother recently. She told him that she tested positive for Trichomonas. History reviewed. No pertinent past medical history.  There are no active problems to display for this patient.   History reviewed. No pertinent surgical history.     Home Medications    Prior to Admission medications   Not on File    Family History History reviewed. No pertinent family history.  Social History Social History  Substance Use Topics  . Smoking status: Current Every Day Smoker    Packs/day: 2.00    Types: Cigarettes  . Smokeless tobacco: Never Used  . Alcohol use Yes     Comment: weekly     Allergies   Patient has no known allergies.   Review of Systems Review of Systems  All other systems reviewed and are negative.    Physical Exam Updated Vital Signs BP (!) 153/103 (BP Location: Left Arm)   Pulse 69   Temp 98.2 F (36.8 C)   Resp 16   Ht 1.854 m (6\' 1" )   Wt 79.4 kg (175 lb)   SpO2 100%   BMI 23.09 kg/m   Physical Exam  Constitutional: He appears well-developed and well-nourished. No distress.  HENT:  Head: Normocephalic and atraumatic.  Right Ear: External ear normal.    Left Ear: External ear normal.  Eyes: Conjunctivae are normal. Right eye exhibits no discharge. Left eye exhibits no discharge. No scleral icterus.  Neck: Neck supple. No tracheal deviation present.  Cardiovascular: Normal rate.   Pulmonary/Chest: Effort normal. No stridor. No respiratory distress.  Abdominal: He exhibits no distension. There is no tenderness. There is no guarding. Hernia confirmed negative in the right inguinal area and confirmed negative in the left inguinal area.  Genitourinary: Testes normal and penis normal. Right testis shows no mass, no swelling and no tenderness. Left testis shows no mass, no swelling and no tenderness.  Genitourinary Comments: Few  small mobile lymph nodes in the right inguinal region  Musculoskeletal: He exhibits no edema.  Neurological: He is alert. Cranial nerve deficit: no gross deficits.  Skin: Skin is warm and dry. No rash noted.  Psychiatric: He has a normal mood and affect.  Nursing note and vitals reviewed.    ED Treatments / Results  Labs (all labs ordered are listed, but only abnormal results are displayed) Labs Reviewed  URINALYSIS, ROUTINE W REFLEX MICROSCOPIC  RPR  HIV ANTIBODY (ROUTINE TESTING)  GC/CHLAMYDIA PROBE AMP ([place]      Procedures Procedures (including critical care time)  Medications Ordered in ED Medications  cefTRIAXone (ROCEPHIN) injection 250 mg (not administered)  azithromycin (ZITHROMAX) tablet  1,000 mg (not administered)  metroNIDAZOLE (FLAGYL) tablet 2,000 mg (not administered)     Initial Impression / Assessment and Plan / ED Course  I have reviewed the triage vital signs and the nursing notes.  Pertinent labs & imaging results that were available during my care of the patient were reviewed by me and considered in my medical decision making (see chart for details).   patient has no testicular tenderness on exam. I'm not concerned about testicular torsion. No mass to suggest  testicular cancer. Patient's symptoms could be related to the Trichomonas exposure. I have ordered additional STD testing. He is not having any penile discharge so we will do a GC chlamydia off his urine.  Final Clinical Impressions(s) / ED Diagnoses   Final diagnoses:  Trichomonas exposure    New Prescriptions New Prescriptions   No medications on file     Linwood DibblesKnapp, Jorel Gravlin, MD 12/09/16 1414

## 2016-12-09 NOTE — ED Notes (Signed)
Per lab, Pt did not provide enough urine for all tests ordered. MD and RN informed. Pt given water to drink to try and give another sample.

## 2016-12-10 LAB — RPR: RPR Ser Ql: NONREACTIVE

## 2016-12-10 LAB — HIV ANTIBODY (ROUTINE TESTING W REFLEX): HIV Screen 4th Generation wRfx: NONREACTIVE

## 2016-12-11 LAB — GC/CHLAMYDIA PROBE AMP (~~LOC~~) NOT AT ARMC
Chlamydia: NEGATIVE
Neisseria Gonorrhea: NEGATIVE

## 2017-10-12 ENCOUNTER — Emergency Department (HOSPITAL_BASED_OUTPATIENT_CLINIC_OR_DEPARTMENT_OTHER): Payer: Self-pay

## 2017-10-12 ENCOUNTER — Emergency Department (HOSPITAL_BASED_OUTPATIENT_CLINIC_OR_DEPARTMENT_OTHER)
Admission: EM | Admit: 2017-10-12 | Discharge: 2017-10-12 | Disposition: A | Discharge status: Home / Self Care | Payer: Self-pay | Attending: Emergency Medicine | Admitting: Emergency Medicine

## 2017-10-12 ENCOUNTER — Encounter (HOSPITAL_BASED_OUTPATIENT_CLINIC_OR_DEPARTMENT_OTHER): Payer: Self-pay | Admitting: Emergency Medicine

## 2017-10-12 ENCOUNTER — Other Ambulatory Visit: Payer: Self-pay

## 2017-10-12 DIAGNOSIS — W01198A Fall on same level from slipping, tripping and stumbling with subsequent striking against other object, initial encounter: Secondary | ICD-10-CM | POA: Insufficient documentation

## 2017-10-12 DIAGNOSIS — S60222A Contusion of left hand, initial encounter: Secondary | ICD-10-CM | POA: Insufficient documentation

## 2017-10-12 DIAGNOSIS — F17210 Nicotine dependence, cigarettes, uncomplicated: Secondary | ICD-10-CM | POA: Insufficient documentation

## 2017-10-12 DIAGNOSIS — Y9289 Other specified places as the place of occurrence of the external cause: Secondary | ICD-10-CM | POA: Insufficient documentation

## 2017-10-12 DIAGNOSIS — Y999 Unspecified external cause status: Secondary | ICD-10-CM | POA: Insufficient documentation

## 2017-10-12 DIAGNOSIS — Y9389 Activity, other specified: Secondary | ICD-10-CM | POA: Insufficient documentation

## 2017-10-12 MED ORDER — NAPROXEN 500 MG PO TABS: 500.0000 mg | ORAL_TABLET | Freq: Two times a day (BID) | ORAL | 0 refills | Status: AC | PRN

## 2017-10-12 NOTE — ED Triage Notes (Addendum)
Reports fall last night-c/o pain to lateral left hand.  Denies any other injuries.

## 2017-10-12 NOTE — Discharge Instructions (Signed)
It was my pleasure taking care of you today!   Fortunately, your x-ray today was negative.   Ice affected area for pain / swelling relief.  Naproxen as needed for pain.   If your hand is not feeling better in 1 week, call the orthopedic doctor listed to schedule a follow up appointment.   Return to ER for new or worsening symptoms, any additional concerns.
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
## 2019-06-07 IMAGING — DX DG HAND COMPLETE 3+V*L*
3 series · 3 of 3 positions shown · non-contrast
Comparison: None.

CLINICAL DATA: C/o LT hand pain over 5th metacarpal after fall last
night.

EXAM:
LEFT HAND - COMPLETE 3+ VIEW

[hand pa]
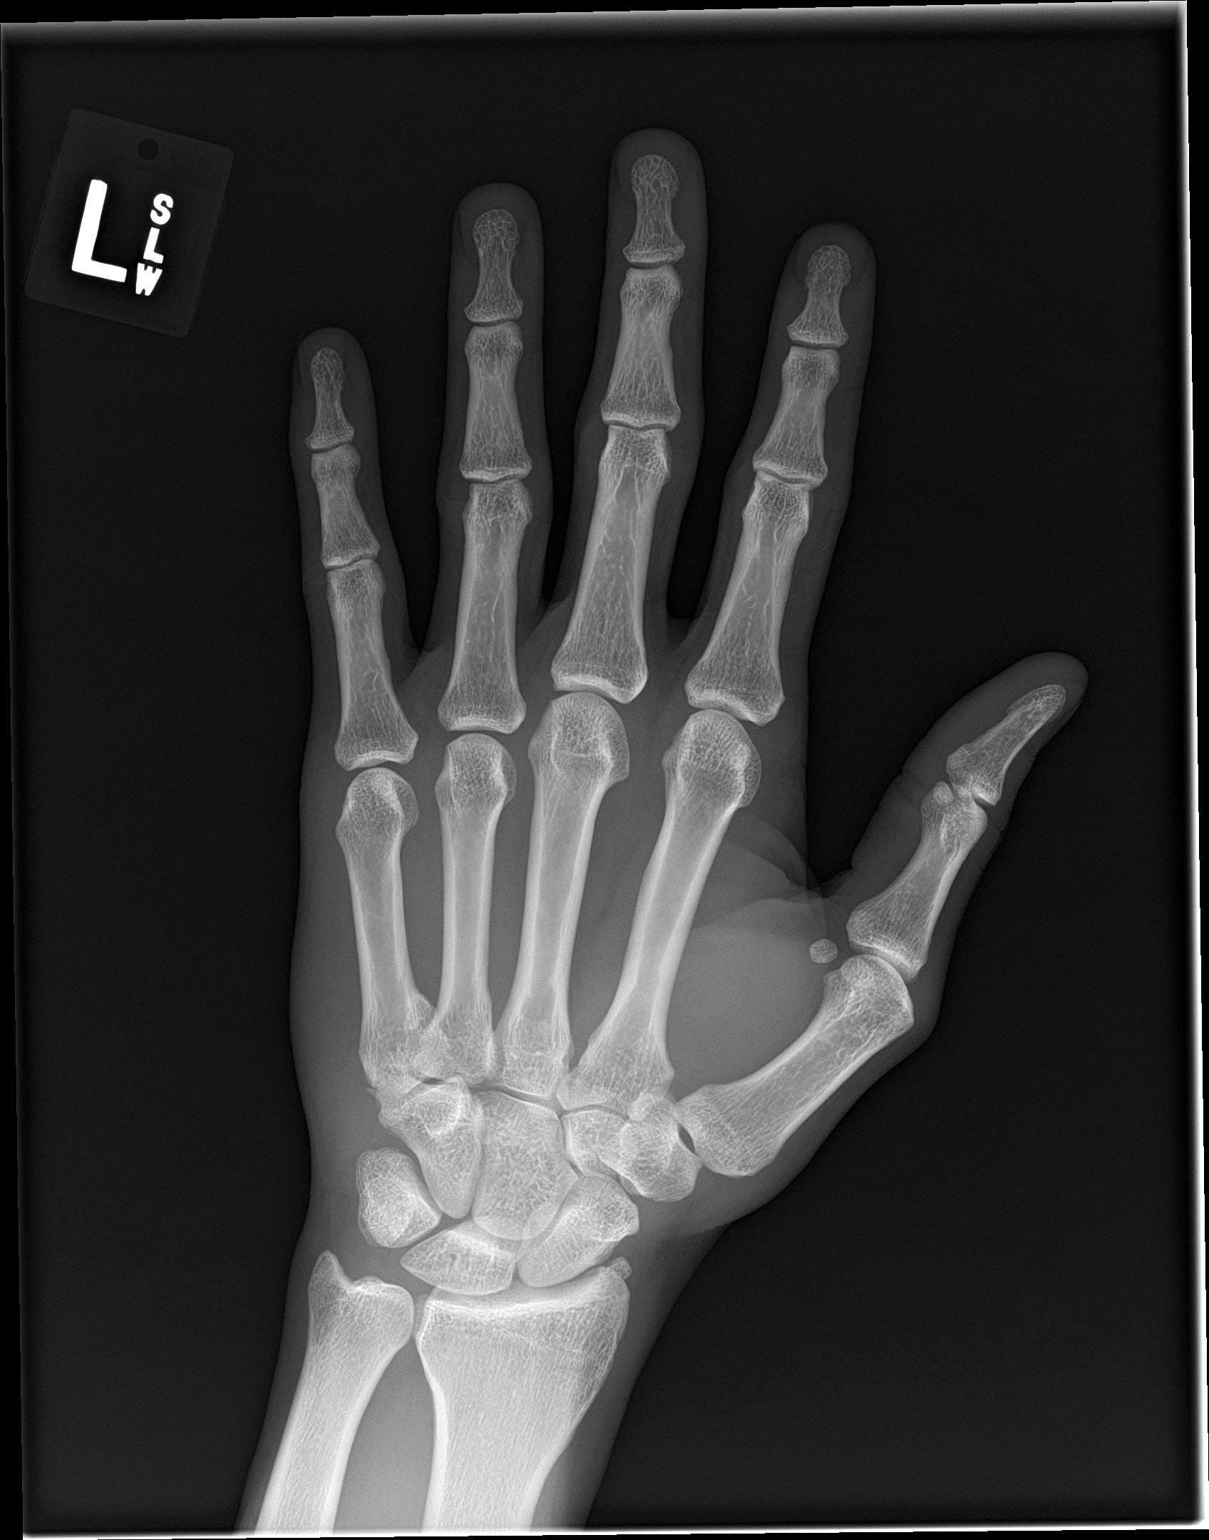

[hand obl]
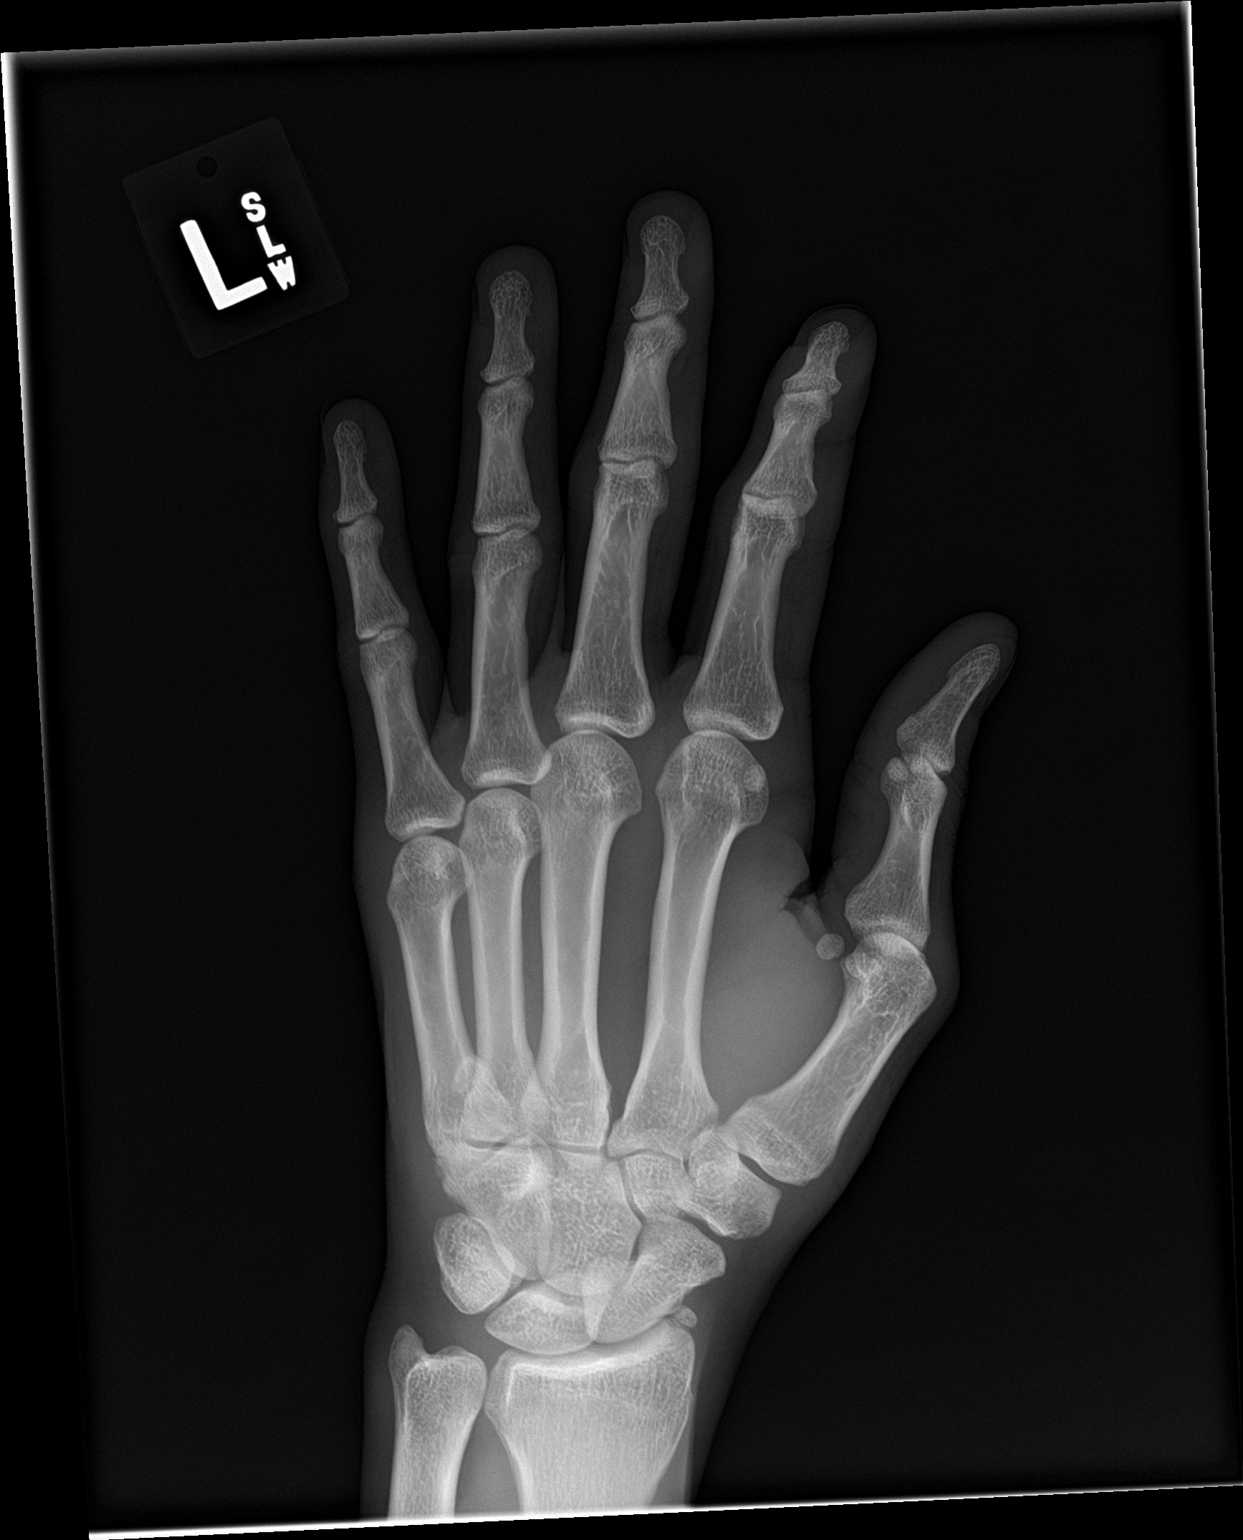

[hand lat]
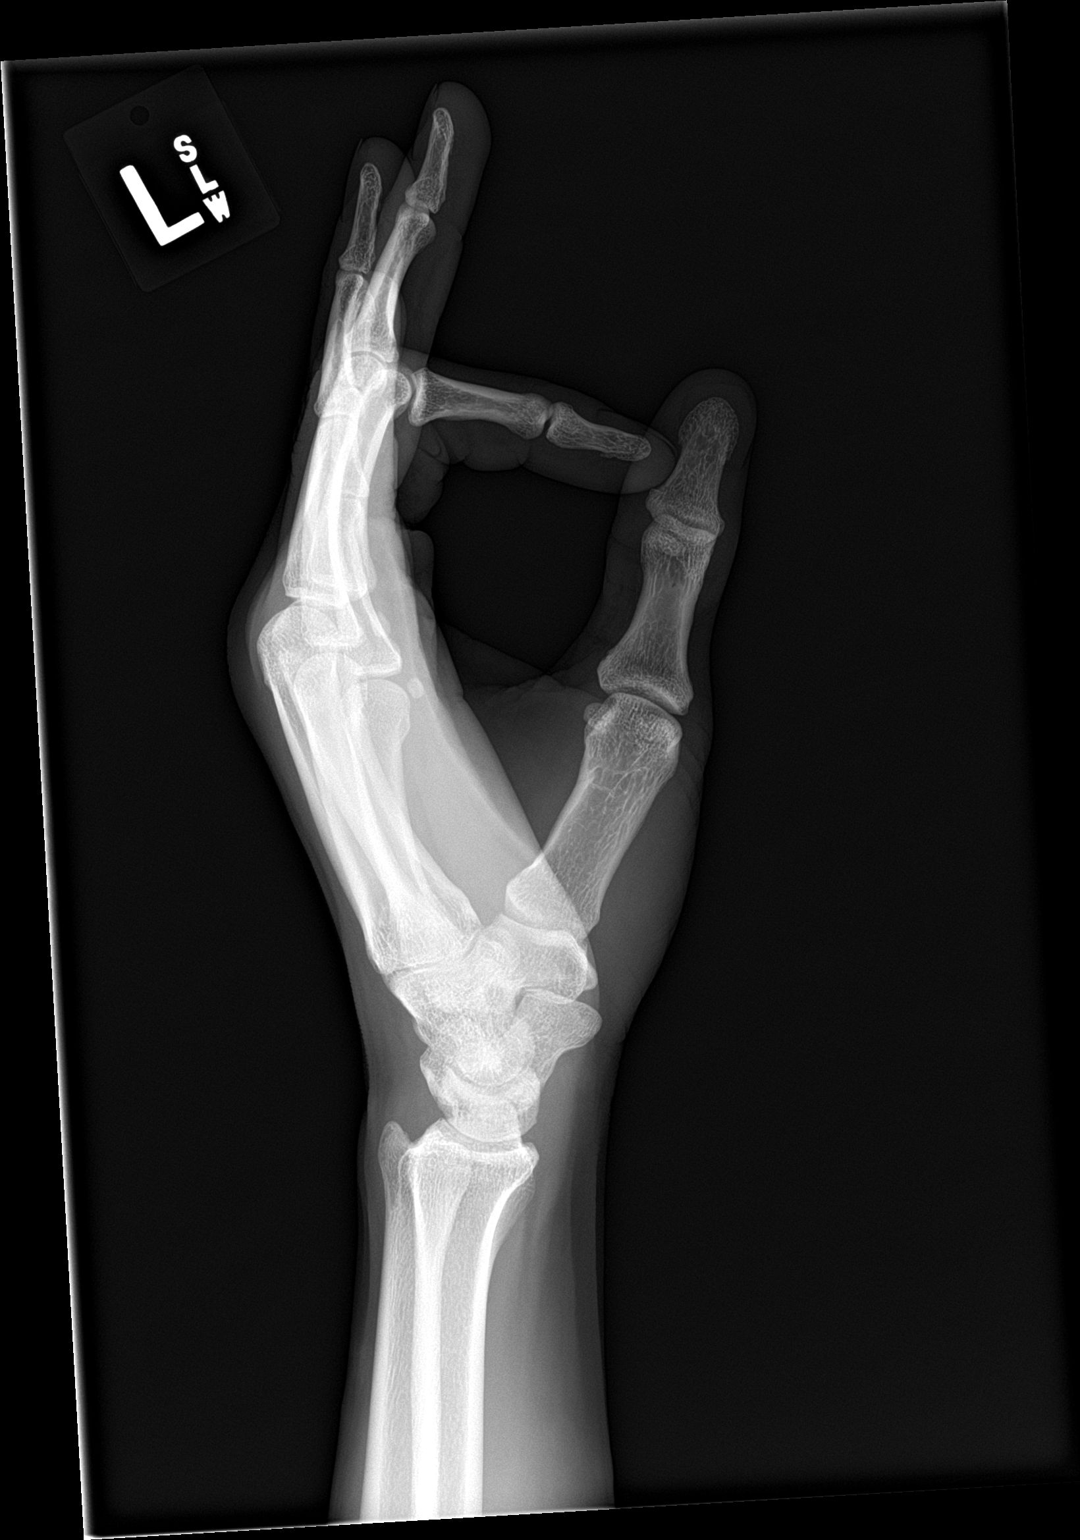

[3 of 3 positions shown; findings below may reference images not displayed]

FINDINGS: There is no evidence of fracture or dislocation. There is no
evidence of arthropathy or other focal bone abnormality. Soft
tissues are unremarkable.
IMPRESSION: Negative.
